# Patient Record
Sex: Female | Born: 1970 | Race: White | Hispanic: No | Marital: Single | Smoking: Current every day smoker
Health system: Southern US, Community
[De-identification: ages and names within clinical notes are randomized; demographics above are authoritative.]

## PROBLEM LIST (undated history)

## (undated) HISTORY — PX: CHOLECYSTECTOMY: SHX55

## (undated) HISTORY — PX: TUBAL LIGATION: SHX77

---

## 2003-11-19 ENCOUNTER — Emergency Department: Payer: Self-pay | Admitting: Emergency Medicine

## 2004-04-17 ENCOUNTER — Emergency Department: Payer: Self-pay | Admitting: General Practice

## 2004-04-18 ENCOUNTER — Ambulatory Visit: Payer: Self-pay | Admitting: General Practice

## 2005-11-23 ENCOUNTER — Emergency Department: Payer: Self-pay

## 2006-01-30 ENCOUNTER — Emergency Department: Payer: Self-pay | Admitting: General Practice

## 2006-09-23 ENCOUNTER — Other Ambulatory Visit: Payer: Self-pay

## 2006-09-23 ENCOUNTER — Emergency Department: Payer: Self-pay | Admitting: Internal Medicine

## 2007-08-30 ENCOUNTER — Emergency Department: Payer: Self-pay | Admitting: Emergency Medicine

## 2007-09-11 ENCOUNTER — Emergency Department: Payer: Self-pay | Admitting: Emergency Medicine

## 2008-12-03 ENCOUNTER — Emergency Department: Payer: Self-pay | Admitting: Emergency Medicine

## 2009-11-30 ENCOUNTER — Inpatient Hospital Stay: Payer: Self-pay | Admitting: Surgery

## 2009-12-04 LAB — PATHOLOGY REPORT

## 2009-12-19 ENCOUNTER — Inpatient Hospital Stay: Payer: Self-pay | Admitting: Surgery

## 2009-12-23 LAB — PATHOLOGY REPORT

## 2010-01-23 ENCOUNTER — Emergency Department: Payer: Self-pay | Admitting: Emergency Medicine

## 2010-07-13 ENCOUNTER — Emergency Department: Payer: Self-pay | Admitting: *Deleted

## 2011-01-11 ENCOUNTER — Ambulatory Visit: Payer: Self-pay | Admitting: Family Medicine

## 2011-11-25 ENCOUNTER — Emergency Department: Payer: Self-pay | Admitting: Emergency Medicine

## 2011-11-25 LAB — URINALYSIS, COMPLETE
Bacteria: NONE SEEN
Bilirubin,UR: NEGATIVE
Blood: NEGATIVE
Glucose,UR: NEGATIVE mg/dL (ref 0–75)
Leukocyte Esterase: NEGATIVE
Nitrite: NEGATIVE
RBC,UR: 4 /HPF (ref 0–5)
Specific Gravity: 1.019 (ref 1.003–1.030)
Squamous Epithelial: 1
WBC UR: <1 /HPF (ref 0–5)

## 2011-11-25 LAB — CBC
HCT: 42.9 % (ref 35.0–47.0)
HGB: 15.1 g/dL (ref 12.0–16.0)
MCH: 32.7 pg (ref 26.0–34.0)
MCHC: 35.2 g/dL (ref 32.0–36.0)
MCV: 93 fL (ref 80–100)
Platelet: 248 10*3/uL (ref 150–440)

## 2011-11-25 LAB — TROPONIN I: Troponin-I: <0.02 ng/mL

## 2011-11-25 LAB — COMPREHENSIVE METABOLIC PANEL
BUN: 8 mg/dL (ref 7–18)
Bilirubin,Total: 0.3 mg/dL (ref 0.2–1.0)
Calcium, Total: 8.8 mg/dL (ref 8.5–10.1)
Chloride: 109 mmol/L — ABNORMAL HIGH (ref 98–107)
Co2: 24 mmol/L (ref 21–32)
EGFR (African American): >60
EGFR (Non-African Amer.): >60
Osmolality: 283 (ref 275–301)
Potassium: 3.7 mmol/L (ref 3.5–5.1)
SGOT(AST): 28 U/L (ref 15–37)
SGPT (ALT): 48 U/L (ref 12–78)

## 2011-11-25 LAB — LIPASE, BLOOD: Lipase: 165 U/L (ref 73–393)

## 2012-03-15 ENCOUNTER — Emergency Department: Payer: Self-pay

## 2012-03-15 LAB — CBC
HCT: 44.4 % (ref 35.0–47.0)
MCV: 94 fL (ref 80–100)
RBC: 4.72 10*6/uL (ref 3.80–5.20)
RDW: 13 % (ref 11.5–14.5)
WBC: 7.5 10*3/uL (ref 3.6–11.0)

## 2012-03-15 LAB — BASIC METABOLIC PANEL
BUN: 12 mg/dL (ref 7–18)
Calcium, Total: 8.7 mg/dL (ref 8.5–10.1)
Co2: 23 mmol/L (ref 21–32)
Creatinine: 0.79 mg/dL (ref 0.60–1.30)
EGFR (Non-African Amer.): >60
Osmolality: 277 (ref 275–301)
Potassium: 4.2 mmol/L (ref 3.5–5.1)
Sodium: 139 mmol/L (ref 136–145)

## 2012-03-15 LAB — CK TOTAL AND CKMB (NOT AT ARMC): CK-MB: <0.5 ng/mL — ABNORMAL LOW (ref 0.5–3.6)

## 2012-03-15 LAB — URINALYSIS, COMPLETE
Bilirubin,UR: NEGATIVE
Nitrite: NEGATIVE
Ph: 6 (ref 4.5–8.0)
Protein: NEGATIVE
RBC,UR: 2 /HPF (ref 0–5)

## 2012-03-15 LAB — TROPONIN I
Troponin-I: <0.02 ng/mL
Troponin-I: <0.02 ng/mL

## 2012-03-15 LAB — TSH: Thyroid Stimulating Horm: 4.13 u[IU]/mL

## 2012-06-30 ENCOUNTER — Emergency Department: Payer: Self-pay | Admitting: Emergency Medicine

## 2012-09-30 ENCOUNTER — Emergency Department: Payer: Self-pay | Admitting: Emergency Medicine

## 2013-06-28 ENCOUNTER — Emergency Department: Payer: Self-pay | Admitting: Emergency Medicine

## 2013-06-28 LAB — URINALYSIS, COMPLETE
Bilirubin,UR: NEGATIVE
GLUCOSE, UR: NEGATIVE mg/dL (ref 0–75)
Ketone: NEGATIVE
Leukocyte Esterase: NEGATIVE
Nitrite: NEGATIVE
Ph: 5 (ref 4.5–8.0)
Protein: NEGATIVE
RBC,UR: 5 /HPF (ref 0–5)
Specific Gravity: 1.021 (ref 1.003–1.030)
WBC UR: 2 /HPF (ref 0–5)

## 2013-06-28 LAB — COMPREHENSIVE METABOLIC PANEL
ALBUMIN: 4.1 g/dL (ref 3.4–5.0)
AST: 27 U/L (ref 15–37)
Alkaline Phosphatase: 49 U/L
Anion Gap: 6 — ABNORMAL LOW (ref 7–16)
BILIRUBIN TOTAL: 0.3 mg/dL (ref 0.2–1.0)
BUN: 9 mg/dL (ref 7–18)
CALCIUM: 9.1 mg/dL (ref 8.5–10.1)
CHLORIDE: 107 mmol/L (ref 98–107)
CO2: 22 mmol/L (ref 21–32)
Creatinine: 0.85 mg/dL (ref 0.60–1.30)
EGFR (Non-African Amer.): >60
Glucose: 77 mg/dL (ref 65–99)
OSMOLALITY: 268 (ref 275–301)
Potassium: 3.7 mmol/L (ref 3.5–5.1)
SGPT (ALT): 19 U/L (ref 12–78)
Sodium: 135 mmol/L — ABNORMAL LOW (ref 136–145)
TOTAL PROTEIN: 7.7 g/dL (ref 6.4–8.2)

## 2013-06-28 LAB — CBC WITH DIFFERENTIAL/PLATELET
Basophil #: 0.1 10*3/uL (ref 0.0–0.1)
Basophil %: 0.6 %
Eosinophil #: 0.1 10*3/uL (ref 0.0–0.7)
Eosinophil %: 1.1 %
HCT: 46.6 % (ref 35.0–47.0)
HGB: 15.7 g/dL (ref 12.0–16.0)
LYMPHS PCT: 32 %
Lymphocyte #: 3.4 10*3/uL (ref 1.0–3.6)
MCH: 32.1 pg (ref 26.0–34.0)
MCHC: 33.8 g/dL (ref 32.0–36.0)
MCV: 95 fL (ref 80–100)
MONO ABS: 0.9 x10 3/mm (ref 0.2–0.9)
Monocyte %: 8 %
NEUTROS PCT: 58.3 %
Neutrophil #: 6.3 10*3/uL (ref 1.4–6.5)
Platelet: 230 10*3/uL (ref 150–440)
RBC: 4.91 10*6/uL (ref 3.80–5.20)
RDW: 13.2 % (ref 11.5–14.5)
WBC: 10.7 10*3/uL (ref 3.6–11.0)

## 2013-06-28 LAB — GC/CHLAMYDIA PROBE AMP

## 2013-06-28 LAB — TROPONIN I
Troponin-I: <0.02 ng/mL
Troponin-I: <0.02 ng/mL

## 2013-06-28 LAB — HCG, QUANTITATIVE, PREGNANCY: Beta Hcg, Quant.: <1 m[IU]/mL — ABNORMAL LOW

## 2013-06-28 LAB — WET PREP, GENITAL

## 2013-10-30 IMAGING — CT CT HEAD WITHOUT CONTRAST
1 series · 16 of 30 positions shown, 20 images · non-contrast
Comparison: none

REASON FOR EXAM: Frequent syncopal episodes (1 per week +)
COMMENTS:

[Series 2: soft tissue · axial · 0.38mm/px · z∈[+992,+1128]mm · 16 of 31 slices shown, 20 images]
[im 2/31  brain]
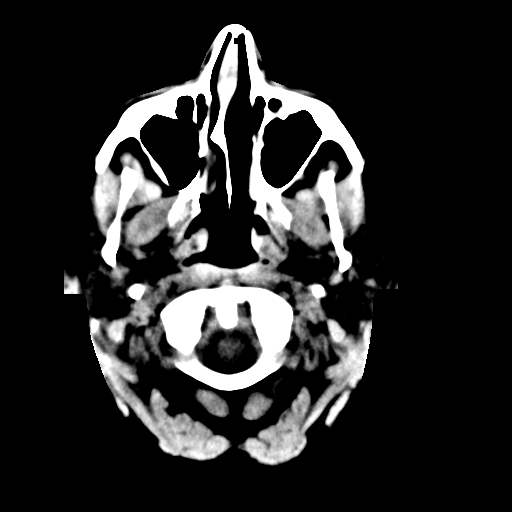
[im 2/31  bone]
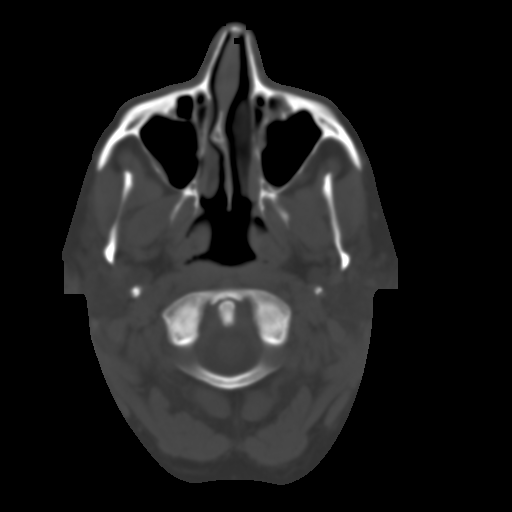
[im 4/31  brain]
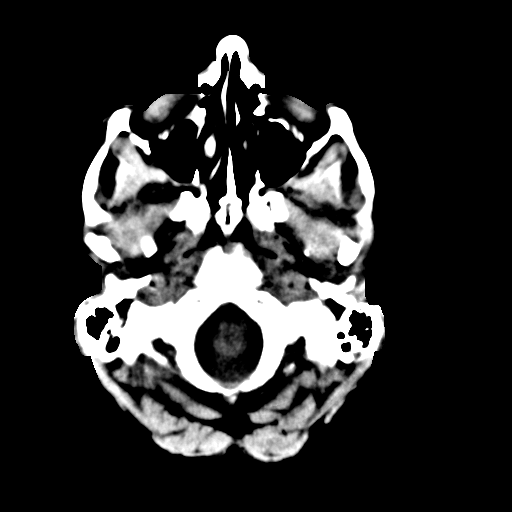
[im 6/31  brain]
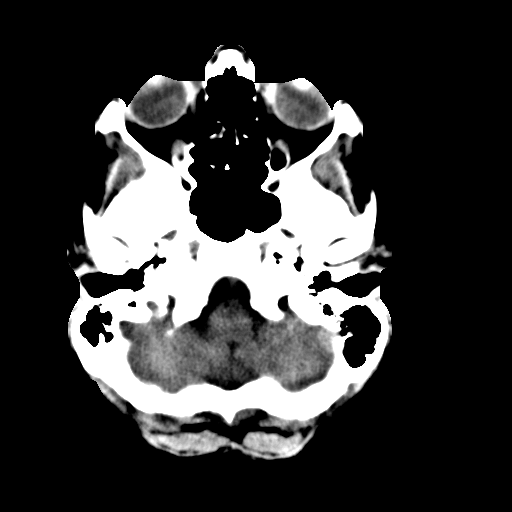
[im 8/31  brain]
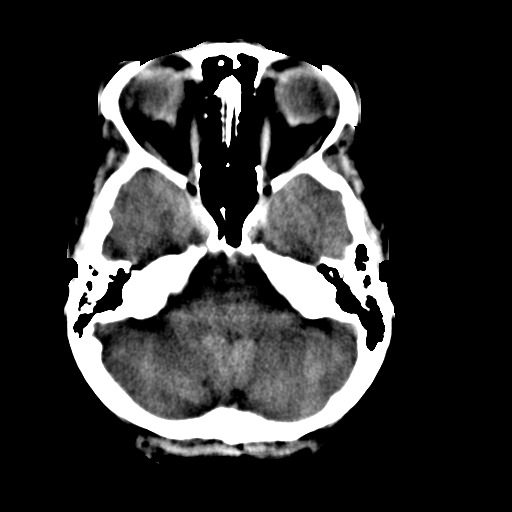
[im 9/31  brain]
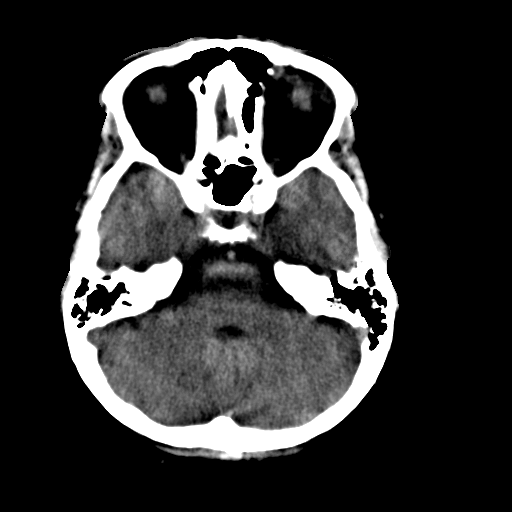
[im 9/31  bone]
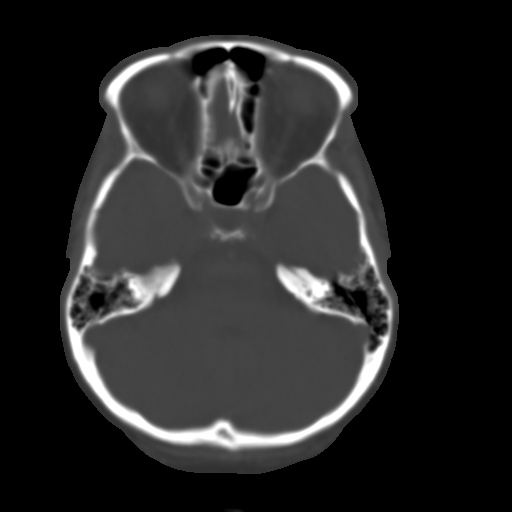
[im 11/31  brain]
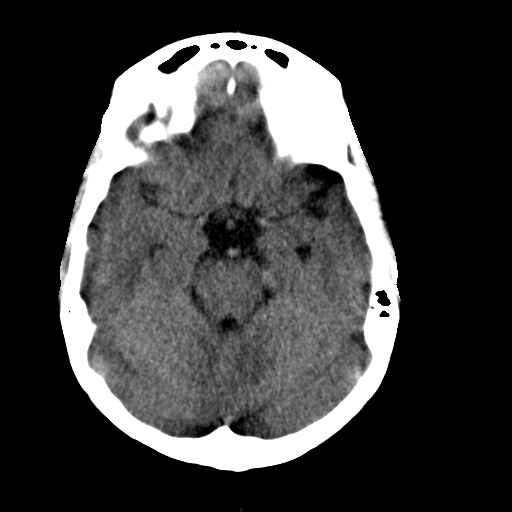
[im 13/31  brain]
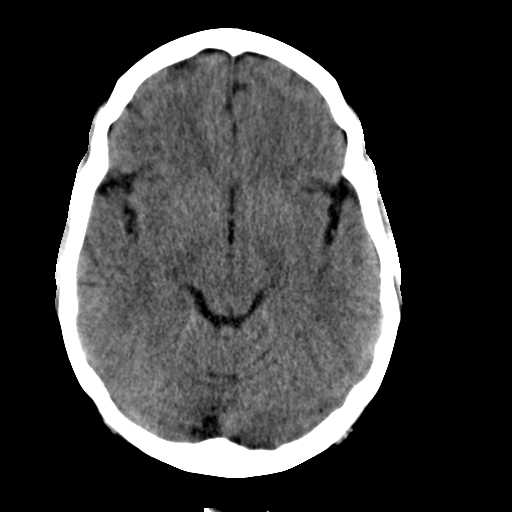
[im 15/31  brain]
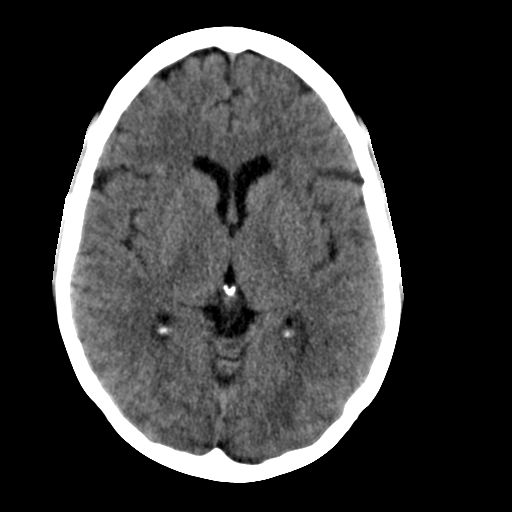
[im 16/31  brain]
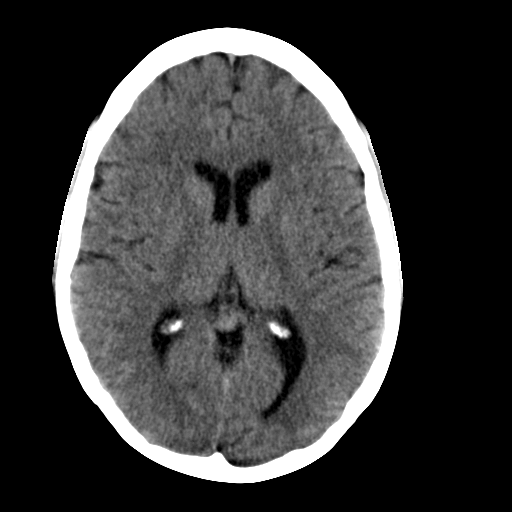
[im 16/31  bone]
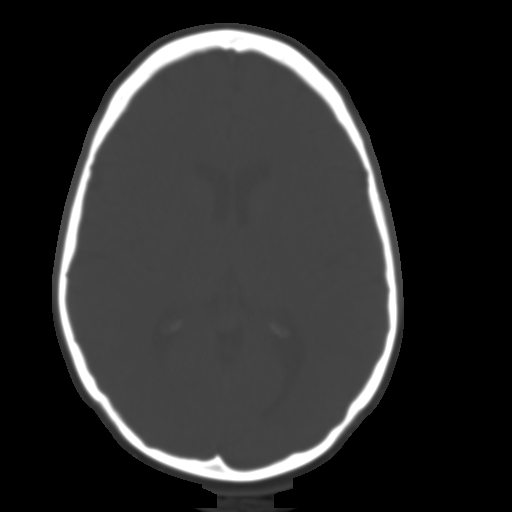
[im 18/31  brain]
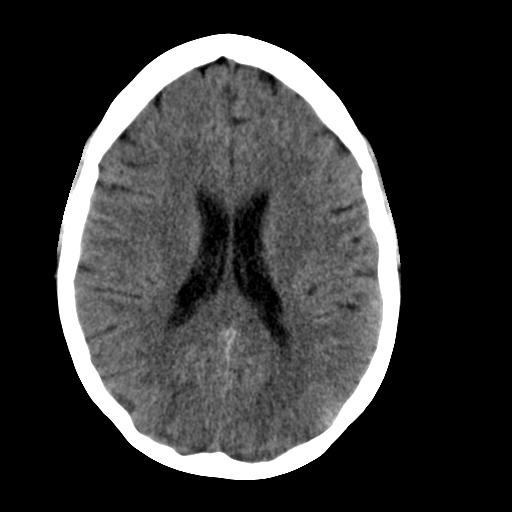
[im 20/31  brain]
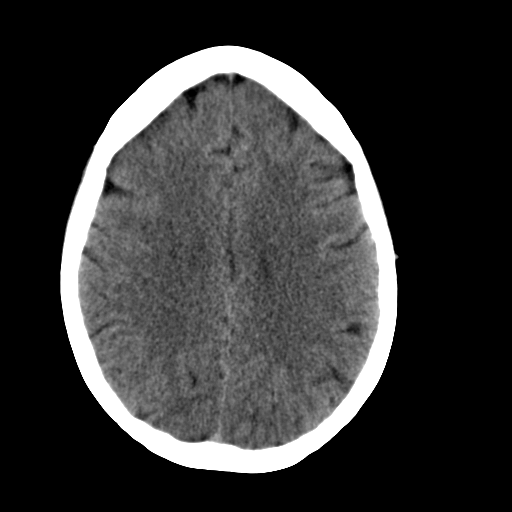
[im 22/31  brain]
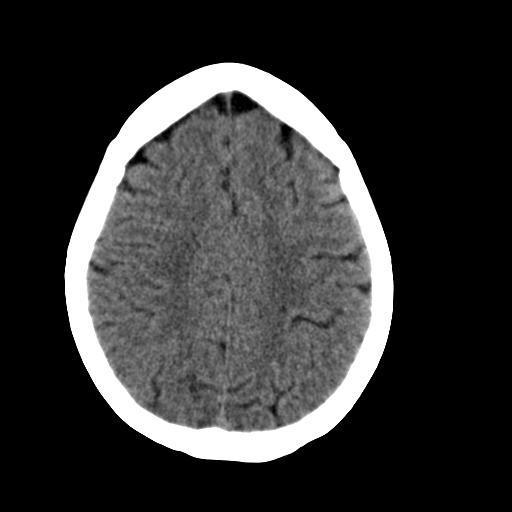
[im 23/31  brain]
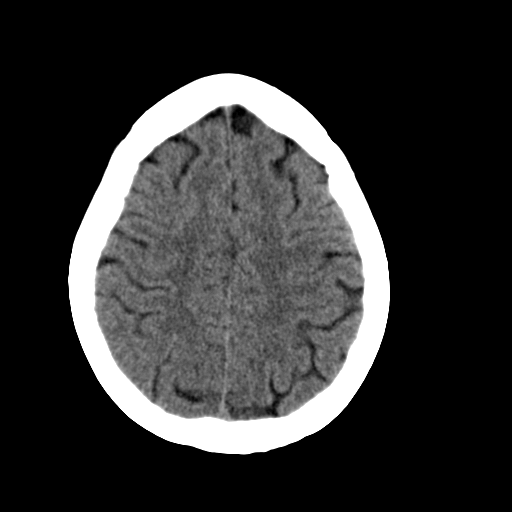
[im 23/31  bone]
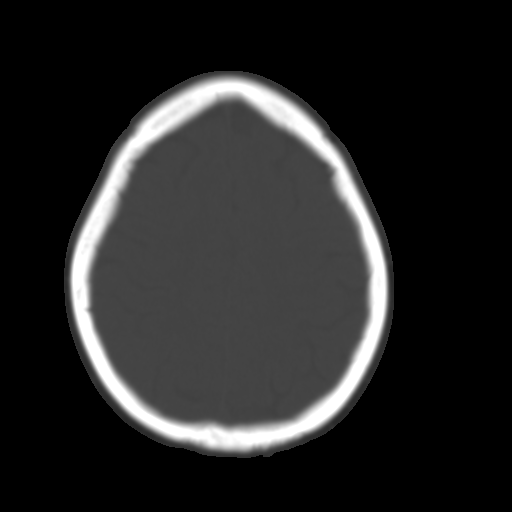
[im 25/31  brain]
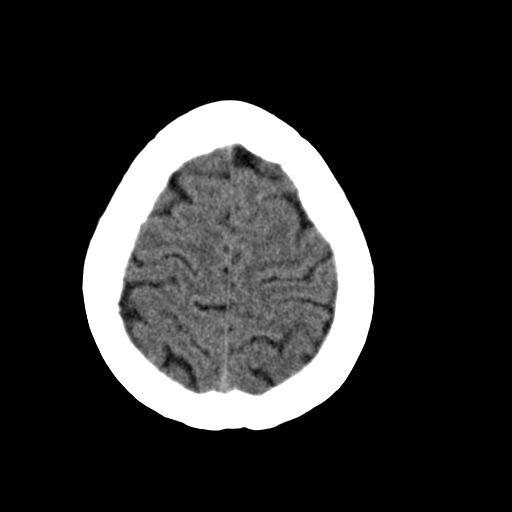
[im 27/31  brain]
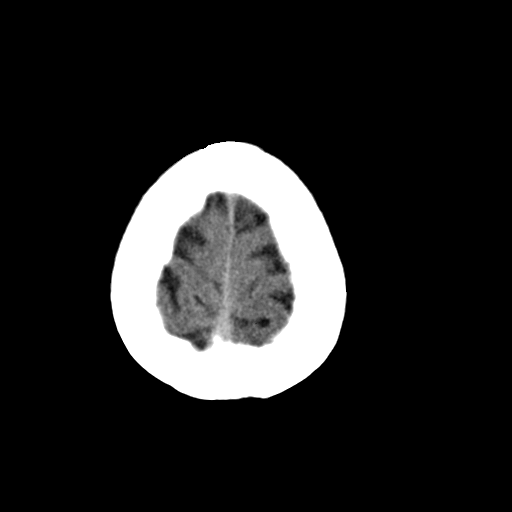
[im 29/31  brain]
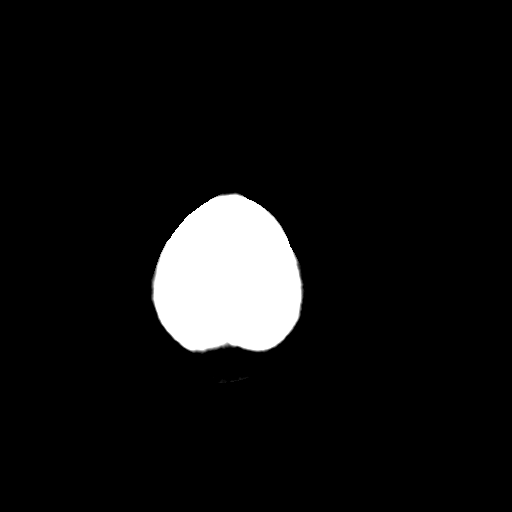

[16 of 30 positions shown; findings below may reference images not displayed]

PROCEDURE:     CT  - CT HEAD WITHOUT CONTRAST  - March 15, 2012  [DATE]

RESULT:     Axial noncontrast CT scanning was performed through the abdomen
and pelvis with reconstructions at 3 mm intervals and slice thicknesses.

The ventricles are normal in size and position. There is no intracranial
hemorrhage nor intracranial mass effect. The cerebellum and brainstem are
normal in density. There is no evidence of an evolving ischemic infarction.
IMPRESSION: There is no acute intracranial abnormality.

[REDACTED]

## 2014-01-23 ENCOUNTER — Emergency Department: Payer: Self-pay | Admitting: Emergency Medicine

## 2014-04-02 ENCOUNTER — Emergency Department: Payer: Self-pay | Admitting: Emergency Medicine

## 2014-08-08 ENCOUNTER — Other Ambulatory Visit: Payer: Self-pay | Admitting: Family Medicine

## 2014-08-08 DIAGNOSIS — R634 Abnormal weight loss: Secondary | ICD-10-CM

## 2014-08-08 DIAGNOSIS — M5440 Lumbago with sciatica, unspecified side: Secondary | ICD-10-CM

## 2014-08-14 ENCOUNTER — Ambulatory Visit
Admission: RE | Admit: 2014-08-14 | Discharge: 2014-08-14 | Disposition: A | Discharge status: Home / Self Care | Payer: Medicaid Other | Source: Ambulatory Visit | Attending: Family Medicine | Admitting: Family Medicine

## 2014-08-14 DIAGNOSIS — M5440 Lumbago with sciatica, unspecified side: Secondary | ICD-10-CM

## 2014-08-14 DIAGNOSIS — M5127 Other intervertebral disc displacement, lumbosacral region: Secondary | ICD-10-CM | POA: Insufficient documentation

## 2014-08-14 DIAGNOSIS — R634 Abnormal weight loss: Secondary | ICD-10-CM | POA: Insufficient documentation

## 2014-08-14 DIAGNOSIS — M545 Low back pain: Secondary | ICD-10-CM | POA: Diagnosis present

## 2015-07-07 ENCOUNTER — Encounter: Payer: Self-pay | Admitting: Emergency Medicine

## 2015-07-07 ENCOUNTER — Emergency Department
Admission: EM | Admit: 2015-07-07 | Discharge: 2015-07-07 | Disposition: A | Discharge status: Home / Self Care | Payer: Medicaid Other | Attending: Emergency Medicine | Admitting: Emergency Medicine

## 2015-07-07 DIAGNOSIS — K029 Dental caries, unspecified: Secondary | ICD-10-CM | POA: Insufficient documentation

## 2015-07-07 DIAGNOSIS — K047 Periapical abscess without sinus: Secondary | ICD-10-CM

## 2015-07-07 DIAGNOSIS — F17200 Nicotine dependence, unspecified, uncomplicated: Secondary | ICD-10-CM | POA: Insufficient documentation

## 2015-07-07 MED ORDER — LIDOCAINE VISCOUS 2 % MT SOLN: 20.0000 mL | OROMUCOSAL | Status: DC | PRN

## 2015-07-07 MED ORDER — KETOROLAC TROMETHAMINE 60 MG/2ML IM SOLN
60.0000 mg | Freq: Once | INTRAMUSCULAR | Status: AC
  Administered 2015-07-07: 60 mg via INTRAMUSCULAR
  Filled 2015-07-07: qty 2

## 2015-07-07 MED ORDER — CLINDAMYCIN HCL 300 MG PO CAPS: 300.0000 mg | ORAL_CAPSULE | Freq: Three times a day (TID) | ORAL | Status: DC

## 2015-07-07 MED ORDER — OXYCODONE-ACETAMINOPHEN 5-325 MG PO TABS: 1.0000 | ORAL_TABLET | ORAL | Status: DC | PRN

## 2015-07-07 MED ORDER — DIAZEPAM 5 MG PO TABS: 5.0000 mg | ORAL_TABLET | Freq: Three times a day (TID) | ORAL | Status: AC | PRN

## 2015-07-07 MED ORDER — OXYCODONE-ACETAMINOPHEN 5-325 MG PO TABS
1.0000 | ORAL_TABLET | Freq: Once | ORAL | Status: AC
  Administered 2015-07-07: 1 via ORAL
  Filled 2015-07-07: qty 1

## 2015-07-07 NOTE — ED Notes (Signed)
States she has a broken tooth to left lower gumline states pain started on Friday  But became worse on sat swelling noted to left side of face ..also having stabbing pain to left ear

## 2015-07-07 NOTE — ED Provider Notes (Signed)
[facility] Emergency Department Provider Note  ____________________________________________  Time seen: Approximately 8:08 AM  I have reviewed the triage vital signs and the nursing notes.   HISTORY  Chief Complaint Dental Pain    HPI Claudia Sexton is a 45 y.o. female presents for evaluation of severe dental pain to the left lower molar. Patient states that pain is been on and off for years and progressively getting worse but has a dentist. She states the pain is unbearable at this time. No relief with any over-the-counter medication.   History reviewed. No pertinent past medical history.  There are no active problems to display for this patient.   Past Surgical History  Procedure Laterality Date  . Tubal ligation      Current Outpatient Rx  Name  Route  Sig  Dispense  Refill  . clindamycin (CLEOCIN) 300 MG capsule   Oral   Take 1 capsule (300 mg total) by mouth 3 (three) times daily.   30 capsule   0   . diazepam (VALIUM) 5 MG tablet   Oral   Take 1 tablet (5 mg total) by mouth every 8 (eight) hours as needed for sedation. Prior to next dentist appointment   1 tablet   0   . lidocaine (XYLOCAINE) 2 % solution   Mouth/Throat   Use as directed 20 mLs in the mouth or throat as needed for mouth pain.   100 mL   0   . oxyCODONE-acetaminophen (ROXICET) 5-325 MG tablet   Oral   Take 1-2 tablets by mouth every 4 (four) hours as needed for severe pain.   15 tablet   0     Allergies Morphine and related and Penicillins  No family history on file.  Social History Social History  Substance Use Topics  . Smoking status: Current Every Day Smoker  . Smokeless tobacco: None  . Alcohol Use: No    Review of Systems Constitutional: No fever/chills ENT: No sore throat.Extensive dental caries with swollen gum and left jaw.. Cardiovascular: Denies chest pain. Respiratory: Denies shortness of breath. Skin: Negative for  rash. Neurological: Negative for headaches, focal weakness or numbness.  10-point ROS otherwise negative.  ____________________________________________   PHYSICAL EXAM: BP 127/71 mmHg  Pulse 75  Temp(Src) 98.1 F (36.7 C) (Oral)  Resp 16  Ht 5\' 2"  (1.575 m)  Wt 62.596 kg  BMI 25.23 kg/m2  SpO2 99%  LMP 06/26/2015  VITAL SIGNS: ED Triage Vitals  Enc Vitals Group     BP --      Pulse --      Resp --      Temp --      Temp src --      SpO2 --      Weight --      Height --      Head Cir --      Peak Flow --      Pain Score --      Pain Loc --      Pain Edu? --      Excl. in GC? --     Constitutional: Alert and oriented. Well appearing and in no acute distress. Nose: No congestion/rhinnorhea. Mouth/Throat: Obvious dental caries with multiple fractured teeth and dental caries scattered throughout the mouth. Neck: No stridor. Full range of motion nontender no adenopathy noted. Positive adenopathy noted to the left jaw and gum line. Cardiovascular: Normal rate, regular rhythm. Grossly normal heart sounds.  Good peripheral circulation.  Neurologic:  Normal speech and language. No gross focal neurologic deficits are appreciated. No gait instability. Skin:  Skin is warm, dry and intact. No rash noted. Psychiatric: Mood and affect are normal. Speech and behavior are normal.  ____________________________________________   LABS (all labs ordered are listed, but only abnormal results are displayed)  Labs Reviewed - No data to display ____________________________________________  EKG   ____________________________________________  RADIOLOGY   ____________________________________________   PROCEDURES  Procedure(s) performed: None  Critical Care performed: No  ____________________________________________   INITIAL IMPRESSION / ASSESSMENT AND PLAN / ED COURSE  Pertinent labs & imaging results that were available during my care of the patient were reviewed by  me and considered in my medical decision making (see chart for details).  Severe dental caries with dental abscess. Rx given for E-Mycin 3 mg 3 times a day, Percocet 5/325. Viscous lidocaine to use as needed. Patient was prescribed one Valium prior to her next dental visit. She was given a list of dental providers and told to follow up as soon as possible. ____________________________________________   FINAL CLINICAL IMPRESSION(S) / ED DIAGNOSES  Final diagnoses:  Dental abscess  Infected dental carries     This chart was dictated using voice recognition software/Dragon. Despite best efforts to proofread, errors can occur which can change the meaning. Any change was purely unintentional.   Evangeline Dakin, PA-C 07/07/15 0981  Jennye Moccasin, MD 07/07/15 1531

## 2015-07-07 NOTE — Discharge Instructions (Signed)
OPTIONS FOR DENTAL FOLLOW UP CARE ° °[place] [facility] and [facility] - Local Safety Net Dental Clinics °http://www.ncdhhs.gov/dph/oralhealth/services/safetynetclinics.htm °  °[facility] (336-562-3123) ° °[facility] [place] ([phone]) ° °[facility] ([phone] ext [phone]) ° °[facility] ([phone]) ° °[facility] ([phone]) °This clinic caters to the indigent population and is on a lottery system. °Location: °[facility], [address], [place] °Clinic Hours: °Wednesdays from 6pm - 9pm, patients seen by a lottery system. °For dates, call or go to [email or web address]/[email or web address] °Services: °Cleanings, fillings and simple extractions. °Payment Options: °DENTAL WORK IS FREE OF CHARGE. Bring proof of income or support. °Best way to get seen: °Arrive at 5:15 pm - this is a lottery, NOT first come/first serve, so arriving earlier will not increase your chances of being seen. °  °  °[facility] °[phone] °Select option 1 for emergencies °  °Location: °[facility], [address], [place] °Clinic Hours: °No walk-ins accepted - call the day before to schedule an appointment. °Check in times are 9:30 am and 1:30 pm. °Services: °Simple extractions, temporary fillings, pulpectomy/pulp debridement, uncomplicated abscess drainage. °Payment Options: °PAYMENT IS DUE AT THE TIME OF SERVICE.  Fee is usually $100-200, additional surgical procedures (e.g. abscess drainage) may be extra. °Cash, checks, Visa/MasterCard accepted.  Can file Medicaid if patient is covered for dental - patient should call case worker to check. °No discount for [facility] patients. °Best way to get seen: °MUST call the day before and get onto the schedule. Can usually be seen the next 1-2 days. No walk-ins accepted. °  °  °[facility] °[phone] °   °Location: °[facility], [address], [place] °Clinic Hours: °M, W, Th, F 8am or 1:30pm, Tues 9a or 1:30 - first come/first served. °Services: °Simple extractions, temporary fillings, uncomplicated abscess drainage.  You do not need to be an [place] resident. °Payment Options: °PAYMENT IS DUE AT THE TIME OF SERVICE. °Dental insurance, otherwise sliding scale - bring proof of income or support. °Depending on income and treatment needed, cost is usually $50-200. °Best way to get seen: °Arrive early as it is first come/first served. °  °  °[facility] °[phone] °  °Location: °[address] °Clinic Hours: °Mon-Thu 8a-5p °Services: °Most basic dental services including extractions and fillings. °Payment Options: °PAYMENT IS DUE AT THE TIME OF SERVICE. °Sliding scale, up to 50% off - bring proof if income or support. °Medicaid with dental option accepted. °Best way to get seen: °Call to schedule an appointment, can usually be seen within 2 weeks OR they will try to see walk-ins - show up at 8a or 2p (you may have to wait). °  °  °[facility] °[phone] °[place] RESIDENTS ONLY °  °Location: °[facility], [address], [place],  [address] °Clinic Hours: By appointment only. °Monday - Thursday 8am-5pm, Friday 8am-12pm °Services: Cleanings, fillings, extractions. °Payment Options: °PAYMENT IS DUE AT THE TIME OF SERVICE. °Cash, Visa or MasterCard. Sliding scale - $30 minimum per service. °Best way to get seen: °Come in to office, complete packet and make an appointment - need proof of income °or support monies for each household member and proof of [place] residence. °Usually takes about a month to get in. °  °  °[facility] °[phone] °  °Location: °[address]   [place] [facility] Hours: Walk-in Urgent Care Dental Services are offered Monday-Friday  mornings only. The numbers of emergencies accepted daily is limited to the number of providers available. Maximum 15 - Mondays, Wednesdays & Thursdays Maximum 10 - Tuesdays & Fridays Services: You do not need to be a [facility] resident to be seen for a dental emergency. Emergencies are defined as pain, swelling, abnormal bleeding, or dental trauma. Walkins will receive x-rays if needed. NOTE: Dental cleaning is not an emergency. Payment Options: PAYMENT IS DUE AT THE TIME OF SERVICE. Minimum co-pay is $40.00 for uninsured patients. Minimum co-pay is $3.00 for Medicaid with dental coverage. Dental Insurance is accepted and must be presented at time of visit. Medicare does not cover dental. Forms of payment: Cash, credit card, checks. Best way to get seen: If not previously registered with the clinic, walk-in dental registration begins at 7:15 am and is on a first come/first serve basis. If previously registered with the clinic, call to make an appointment.     [facility] [phone] [place] RESIDENTS ONLY   Location: [address] [place], [place] Clinic Hours: Mon-Thu 10a-2p Services: Extractions only! Payment Options: FREE (donations accepted) - bring proof of income or support Best way to get seen: Call and schedule an appointment OR come at 8am on the 1st Monday of every month (except for holidays) when it is first come/first served.     [facility] [phone]   Location: [address] [place], [place] Clinic Hours: Friday mornings Services, Payment Options, Best way to get seen: Call for info  Dental Abscess A dental abscess is a collection of pus in or around a tooth. CAUSES This condition is caused by a bacterial infection around the root of the tooth that involves the inner part of the tooth (pulp). It may result from:  Severe tooth decay.  Trauma to the tooth that allows bacteria to enter into the pulp, such as a broken or chipped  tooth.  Severe gum disease around a tooth. SYMPTOMS Symptoms of this condition include:  Severe pain in and around the infected tooth.  Swelling and redness around the infected tooth, in the mouth, or in the face.  Tenderness.  Pus drainage.  Bad breath.  Bitter taste in the mouth.  Difficulty swallowing.  Difficulty opening the mouth.  Nausea.  Vomiting.  Chills.  Swollen neck glands.  Fever. DIAGNOSIS This condition is diagnosed with examination of the infected tooth. During the exam, your dentist may tap on the infected tooth. Your dentist will also ask about your medical and dental history and may order X-rays. TREATMENT This condition is treated by eliminating the infection. This may be done with:  Antibiotic medicine.  A root canal. This may be performed to save the tooth.  Pulling (extracting) the tooth. This may also involve draining the abscess. This is done if the tooth cannot be saved. HOME CARE INSTRUCTIONS  Take medicines only as directed by your dentist.  If you were prescribed antibiotic medicine, finish all of it even if you start to feel better.  Rinse your mouth (gargle) often with salt water to relieve pain or swelling.  Do not drive or operate heavy machinery while taking pain medicine.  Do not apply heat to the outside of your mouth.  Keep all follow-up visits as directed by your dentist. This is important. SEEK MEDICAL CARE IF:  Your pain is worse and is not helped by medicine. SEEK IMMEDIATE MEDICAL CARE IF:  You have a fever or chills.  Your symptoms suddenly get worse.  You have a very bad headache.  You have problems breathing or swallowing.  You have trouble opening your mouth.  You have swelling in your neck or around your eye.   This information is not intended to replace advice given to you by your health care provider. Make sure you discuss any questions you have with your health care provider.   Document  Released: 01/11/2005 Document Revised: 05/28/2014 Document Reviewed: 01/08/2014 Elsevier Interactive Patient Education 2016 Elsevier Inc.  Dental Caries Dental caries (also called tooth decay) is the most common oral disease. It can occur at any age but is more common in children and young adults.  HOW DENTAL CARIES DEVELOPS  The process of decay begins when bacteria and foods (particularly sugars and starches) combine in your mouth to produce plaque. Plaque is a substance that sticks to the hard, outer surface of a tooth (enamel). The bacteria in plaque produce acids that attack enamel. These acids may also attack the root surface of a tooth (cementum) if it is exposed. Repeated attacks dissolve these surfaces and create holes in the tooth (cavities). If left untreated, the acids destroy the other layers of the tooth.  RISK FACTORS  Frequent sipping of sugary beverages.   Frequent snacking on sugary and starchy foods, especially those that easily get stuck in the teeth.   Poor oral hygiene.   Dry mouth.   Substance abuse such as methamphetamine abuse.   Broken or poor-fitting dental restorations.   Eating disorders.   Gastroesophageal reflux disease (GERD).   Certain radiation treatments to the head and neck. SYMPTOMS In the early stages of dental caries, symptoms are seldom present. Sometimes white, chalky areas may be seen on the enamel or other tooth layers. In later stages, symptoms may include:  Pits and holes on the enamel.  Toothache after sweet, hot, or cold foods or drinks are consumed.  Pain around the tooth.  Swelling around the tooth. DIAGNOSIS  Most of the time, dental caries is detected during a regular dental checkup. A diagnosis is made after a thorough medical and dental history is taken and the surfaces of your teeth are checked for signs of dental caries. Sometimes special instruments, such as lasers, are used to check for dental caries. Dental X-ray  exams may be taken so that areas not visible to the eye (such as between the contact areas of the teeth) can be checked for cavities.  TREATMENT  If dental caries is in its early stages, it may be reversed with a fluoride treatment or an application of a remineralizing agent at the dental office. Thorough brushing and flossing at home is needed to aid these treatments. If it is in its later stages, treatment depends on the location and extent of tooth destruction:   If a small area of the tooth has been destroyed, the destroyed area will be removed and cavities will be filled with a material such as gold, silver amalgam, or composite resin.   If a large area of the tooth has been destroyed, the destroyed area will be removed and a cap (crown) will be fitted over the remaining tooth structure.   If the center part of the tooth (pulp) is affected, a procedure called a root canal will be needed before a filling or crown can be placed.   If most of the tooth has been destroyed, the tooth may need to be pulled (extracted). HOME  CARE INSTRUCTIONS You can prevent, stop, or reverse dental caries at home by practicing good oral hygiene. Good oral hygiene includes:  Thoroughly cleaning your teeth at least twice a day with a toothbrush and dental floss.   Using a fluoride toothpaste. A fluoride mouth rinse may also be used if recommended by your dentist or health care provider.   Restricting the amount of sugary and starchy foods and sugary liquids you consume.   Avoiding frequent snacking on these foods and sipping of these liquids.   Keeping regular visits with a dentist for checkups and cleanings. PREVENTION   Practice good oral hygiene.  Consider a dental sealant. A dental sealant is a coating material that is applied by your dentist to the pits and grooves of teeth. The sealant prevents food from being trapped in them. It may protect the teeth for several years.  Ask about fluoride  supplements if you live in a community without fluorinated water or with water that has a low fluoride content. Use fluoride supplements as directed by your dentist or health care provider.  Allow fluoride varnish applications to teeth if directed by your dentist or health care provider.   This information is not intended to replace advice given to you by your health care provider. Make sure you discuss any questions you have with your health care provider.   Document Released: 10/03/2001 Document Revised: 02/01/2014 Document Reviewed: 01/14/2012 Elsevier Interactive Patient Education [employer]2016 Elsevier Inc.

## 2015-07-11 ENCOUNTER — Encounter: Payer: Self-pay | Admitting: Emergency Medicine

## 2015-07-11 ENCOUNTER — Emergency Department
Admission: EM | Admit: 2015-07-11 | Discharge: 2015-07-11 | Disposition: A | Discharge status: Home / Self Care | Payer: Medicaid Other | Attending: Emergency Medicine | Admitting: Emergency Medicine

## 2015-07-11 DIAGNOSIS — K029 Dental caries, unspecified: Secondary | ICD-10-CM | POA: Insufficient documentation

## 2015-07-11 DIAGNOSIS — S025XXA Fracture of tooth (traumatic), initial encounter for closed fracture: Secondary | ICD-10-CM

## 2015-07-11 DIAGNOSIS — K122 Cellulitis and abscess of mouth: Secondary | ICD-10-CM

## 2015-07-11 DIAGNOSIS — F17200 Nicotine dependence, unspecified, uncomplicated: Secondary | ICD-10-CM | POA: Insufficient documentation

## 2015-07-11 DIAGNOSIS — K047 Periapical abscess without sinus: Secondary | ICD-10-CM | POA: Insufficient documentation

## 2015-07-11 DIAGNOSIS — K0381 Cracked tooth: Secondary | ICD-10-CM | POA: Insufficient documentation

## 2015-07-11 MED ORDER — OXYCODONE-ACETAMINOPHEN 5-325 MG PO TABS
1.0000 | ORAL_TABLET | Freq: Once | ORAL | Status: AC
  Administered 2015-07-11: 1 via ORAL
  Filled 2015-07-11: qty 1

## 2015-07-11 MED ORDER — CEFTRIAXONE SODIUM 1 G IJ SOLR
500.0000 mg | Freq: Once | INTRAMUSCULAR | Status: AC
  Administered 2015-07-11: 500 mg via INTRAMUSCULAR
  Filled 2015-07-11: qty 10

## 2015-07-11 MED ORDER — CEPHALEXIN 500 MG PO CAPS: 500.0000 mg | ORAL_CAPSULE | Freq: Four times a day (QID) | ORAL | Status: AC

## 2015-07-11 NOTE — ED Notes (Signed)
Pt continues to have dental pain was seen on Monday for same. Pt states she is on atbx, but states the pain is in her ear. States she has seen a dentist but requires oral surgery and cannot afford it.

## 2015-07-11 NOTE — Discharge Instructions (Signed)
Dental Abscess    A dental abscess is a collection of pus in or around a tooth.  CAUSES  This condition is caused by a bacterial infection around the root of the tooth that involves the inner part of the tooth (pulp). It may result from:  Severe tooth decay.  Trauma to the tooth that allows bacteria to enter into the pulp, such as a broken or chipped tooth.  Severe gum disease around a tooth. SYMPTOMS  Symptoms of this condition include:  Severe pain in and around the infected tooth.  Swelling and redness around the infected tooth, in the mouth, or in the face.  Tenderness.  Pus drainage.  Bad breath.  Bitter taste in the mouth.  Difficulty swallowing.  Difficulty opening the mouth.  Nausea.  Vomiting.  Chills.  Swollen neck glands.  Fever. DIAGNOSIS  This condition is diagnosed with examination of the infected tooth. During the exam, your dentist may tap on the infected tooth. Your dentist will also ask about your medical and dental history and may order X-rays.  TREATMENT  This condition is treated by eliminating the infection. This may be done with:  Antibiotic medicine.  A root canal. This may be performed to save the tooth.  Pulling (extracting) the tooth. This may also involve draining the abscess. This is done if the tooth cannot be saved. HOME CARE INSTRUCTIONS  Take medicines only as directed by your dentist.  If you were prescribed antibiotic medicine, finish all of it even if you start to feel better.  Rinse your mouth (gargle) often with salt water to relieve pain or swelling.  Do not drive or operate heavy machinery while taking pain medicine.  Do not apply heat to the outside of your mouth.  Keep all follow-up visits as directed by your dentist. This is important. SEEK MEDICAL CARE IF:  Your pain is worse and is not helped by medicine. SEEK IMMEDIATE MEDICAL CARE IF:  You have a fever or chills.  Your symptoms suddenly get worse.  You have a very bad headache.   You have problems breathing or swallowing.  You have trouble opening your mouth.  You have swelling in your neck or around your eye. This information is not intended to replace advice given to you by your health care provider. Make sure you discuss any questions you have with your health care provider.  Document Released: 01/11/2005 Document Revised: 05/28/2014 Document Reviewed: 01/08/2014  Elsevier Interactive Patient Education 2016 [employer]   Dental Caries Dental caries is tooth decay. This decay can cause a hole in teeth (cavity) that can get bigger and deeper over time. HOME CARE  Brush and floss your teeth. Do this at least two times a day.  Use a fluoride toothpaste.  Use a mouth rinse if told by your dentist or doctor.  Eat less sugary and starchy foods. Drink less sugary drinks.  Avoid snacking often on sugary and starchy foods. Avoid sipping often on sugary drinks.  Keep regular checkups and cleanings with your dentist.  Use fluoride supplements if told by your dentist or doctor.  Allow fluoride to be applied to teeth if told by your dentist or doctor.   This information is not intended to replace advice given to you by your health care provider. Make sure you discuss any questions you have with your health care provider.   Document Released: 10/21/2007 Document Revised: 02/01/2014 Document Reviewed: 01/14/2012 Elsevier Interactive Patient Education 2016 [employer]  Dental Care and  Dentist Visits Dental care supports good overall health. Regular dental visits can also help you avoid dental pain, bleeding, infection, and other more serious health problems in the future. It is important to keep the mouth healthy because diseases in the teeth, gums, and other oral tissues can spread to other areas of the body. Some problems, such as diabetes, heart disease, and pre-term labor have been associated with poor oral health.  See your dentist every 6 months. If you  experience emergency problems such as a toothache or broken tooth, go to the dentist right away. If you see your dentist regularly, you may catch problems early. It is easier to be treated for problems in the early stages.  WHAT TO EXPECT AT A DENTIST VISIT  Your dentist will look for many common oral health problems and recommend proper treatment. At your regular dental visit, you can expect:  Gentle cleaning of the teeth and gums. This includes scraping and polishing. This helps to remove the sticky substance around the teeth and gums (plaque). Plaque forms in the mouth shortly after eating. Over time, plaque hardens on the teeth as tartar. If tartar is not removed regularly, it can cause problems. Cleaning also helps remove stains.  Periodic X-rays. These pictures of the teeth and supporting bone will help your dentist assess the health of your teeth.  Periodic fluoride treatments. Fluoride is a natural mineral shown to help strengthen teeth. Fluoride treatmentinvolves applying a fluoride gel or varnish to the teeth. It is most commonly done in children.  Examination of the mouth, tongue, jaws, teeth, and gums to look for any oral health problems, such as:  Cavities (dental caries). This is decay on the tooth caused by plaque, sugar, and acid in the mouth. It is best to catch a cavity when it is small.  Inflammation of the gums caused by plaque buildup (gingivitis).  Problems with the mouth or malformed or misaligned teeth.  Oral cancer or other diseases of the soft tissues or jaws. KEEP YOUR TEETH AND GUMS HEALTHY For healthy teeth and gums, follow these general guidelines as well as your dentist's specific advice:  Have your teeth professionally cleaned at the dentist every 6 months.  Brush twice daily with a fluoride toothpaste.  Floss your teeth daily.  Ask your dentist if you need fluoride supplements, treatments, or fluoride toothpaste.  Eat a healthy diet. Reduce foods and  drinks with added sugar.  Avoid smoking. TREATMENT FOR ORAL HEALTH PROBLEMS If you have oral health problems, treatment varies depending on the conditions present in your teeth and gums.  Your caregiver will most likely recommend good oral hygiene at each visit.  For cavities, gingivitis, or other oral health disease, your caregiver will perform a procedure to treat the problem. This is typically done at a separate appointment. Sometimes your caregiver will refer you to another dental specialist for specific tooth problems or for surgery. SEEK IMMEDIATE DENTAL CARE IF:  You have pain, bleeding, or soreness in the gum, tooth, jaw, or mouth area.  A permanent tooth becomes loose or separated from the gum socket.  You experience a blow or injury to the mouth or jaw area.   This information is not intended to replace advice given to you by your health care provider. Make sure you discuss any questions you have with your health care provider.   Document Released: 09/23/2010 Document Revised: 04/05/2011 Document Reviewed: 09/23/2010 Elsevier Interactive Patient Education 2016 Elsevier Inc.  Dental Pain Dental pain may  be caused by many things, including:  Tooth decay (cavities or caries). Cavities cause the nerve of your tooth to be open to air and hot or cold temperatures. This can cause pain or discomfort.  Abscess or infection. A dental abscess is an area that is full of infected pus from a bacterial infection in the inner part of the tooth (pulp). It usually happens at the end of the tooth's root.  Injury.  An unknown reason (idiopathic). Your pain may be mild or severe. It may only happen when:  You are chewing.  You are exposed to hot or cold temperature.  You are eating or drinking sugary foods or beverages, such as:  Soda.  Candy. Your pain may also be there all of the time. HOME CARE Watch your dental pain for any changes. Do these things to lessen your  discomfort:  Take medicines only as told by your dentist.  If your dentist tells you to take an antibiotic medicine, finish all of it even if you start to feel better.  Keep all follow-up visits as told by your dentist. This is important.  Do not apply heat to the outside of your face.  Rinse your mouth or gargle with salt water if told by your dentist. This helps with pain and swelling.  You can make salt water by adding  tsp of salt to 1 cup of warm water.  Apply ice to the painful area of your face:  Put ice in a plastic bag.  Place a towel between your skin and the bag.  Leave the ice on for 20 minutes, 2-3 times per day.  Avoid foods or drinks that cause you pain, such as:  Very hot or very cold foods or drinks.  Sweet or sugary foods or drinks. GET HELP IF:  Your pain is not helped with medicines.  Your symptoms are worse.  You have new symptoms. GET HELP RIGHT AWAY IF:  You cannot open your mouth.  You are having trouble breathing or swallowing.  You have a fever.  Your face, neck, or jaw is puffy (swollen).   This information is not intended to replace advice given to you by your health care provider. Make sure you discuss any questions you have with your health care provider.   Document Released: 06/30/2007 Document Revised: 05/28/2014 Document Reviewed: 01/07/2014 Elsevier Interactive Patient Education [employer]

## 2015-07-11 NOTE — ED Provider Notes (Signed)
[facility] Emergency Department Provider Note  ____________________________________________  Time seen: Approximately 7:15 AM  I have reviewed the triage vital signs and the nursing notes.   HISTORY  Chief Complaint Dental Pain    HPI Claudia Sexton is a 45 y.o. female , NAD, in pain, presents to the emergency department with continued left-sided dental pain. States she was seen on Monday for the same and has been on antibiotics and pain medications without any side effects. States that the pain has worsened and she feels the swelling is worse at this time. States she has been told by a dentist in [place], [place] that she would need to see an Transport planner for extraction of the affected tooth but she does not have the money to do so. Patient has not consulted her seen one of the local free her sliding scale dental clinics but is willing to do so today. Denies any fevers, chills, body aches. Has not had any numbness, weakness, tingling. Has been able to talk without significant pain. States that it hurts to chew on the left side of her mouth. Has not been able to sleep well over the last night due to the increasing pain. Notes that the pain can radiate into the left ear. Has not had any headaches or visual changes.   History reviewed. No pertinent past medical history.  There are no active problems to display for this patient.   Past Surgical History  Procedure Laterality Date  . Tubal ligation      Current Outpatient Rx  Name  Route  Sig  Dispense  Refill  . cephALEXin (KEFLEX) 500 MG capsule   Oral   Take 1 capsule (500 mg total) by mouth 4 (four) times daily.   40 capsule   0   . clindamycin (CLEOCIN) 300 MG capsule   Oral   Take 1 capsule (300 mg total) by mouth 3 (three) times daily.   30 capsule   0   . diazepam (VALIUM) 5 MG tablet   Oral   Take 1 tablet (5 mg total) by mouth every 8 (eight) hours as needed for sedation. Prior to  next dentist appointment   1 tablet   0   . lidocaine (XYLOCAINE) 2 % solution   Mouth/Throat   Use as directed 20 mLs in the mouth or throat as needed for mouth pain.   100 mL   0   . oxyCODONE-acetaminophen (ROXICET) 5-325 MG tablet   Oral   Take 1-2 tablets by mouth every 4 (four) hours as needed for severe pain.   15 tablet   0     Allergies Morphine and related and Penicillins  History reviewed. No pertinent family history.  Social History Social History  Substance Use Topics  . Smoking status: Current Every Day Smoker  . Smokeless tobacco: None  . Alcohol Use: No     Review of Systems  Constitutional: No fever/chills, fatigue Eyes: No visual changes.  ENT: Positive left ear pain, left dental pain. Cardiovascular: No chest pain. Respiratory: No shortness of breath. No wheezing.  Gastrointestinal: No abdominal pain.  No nausea, vomiting. Musculoskeletal: Negative for neck pain.  Skin: Positive swelling left jaw. Negative for rash, redness, skin sores. Neurological: Negative for headaches, focal weakness or numbness. No tingling 10-point ROS otherwise negative.  ____________________________________________   PHYSICAL EXAM:  VITAL SIGNS: ED Triage Vitals  Enc Vitals Group     BP 07/11/15 0713 135/95 mmHg  Pulse Rate 07/11/15 0713 102     Resp 07/11/15 0713 18     Temp 07/11/15 0713 98.2 F (36.8 C)     Temp Source 07/11/15 0713 Oral     SpO2 07/11/15 0713 100 %     Weight 07/11/15 0713 136 lb (61.689 kg)     Height 07/11/15 0713 5\' 2"  (1.575 m)     Head Cir --      Peak Flow --      Pain Score 07/11/15 0712 6     Pain Loc --      Pain Edu? --      Excl. in GC? --      Constitutional: Alert and oriented. Well appearing and in no acute distress, but in pain. Eyes: Conjunctivae are normal.  Head: Atraumatic. ENT:      Nose: No congestion/rhinnorhea.      Mouth/Throat: Mild erythema and swelling about the left lower gumline. Multiple teeth  with dental caries and fractures. Mucous membranes are moist.  Neck: Supple with full range of motion. No stridor. Hematological/Lymphatic/Immunilogical: No cervical lymphadenopathy. Cardiovascular: Good peripheral circulation. Respiratory: Normal respiratory effort without tachypnea or retractions.  Neurologic:  Normal speech and language. No gross focal neurologic deficits are appreciated.  Skin:  Skin is warm, dry and intact. No rash, redness, swelling, skin sores noted. Psychiatric: Mood and affect are normal. Speech and behavior are normal. Patient exhibits appropriate insight and judgement.   ____________________________________________   LABS  None ____________________________________________  EKG  None ____________________________________________  RADIOLOGY  None ____________________________________________    PROCEDURES  Procedure(s) performed: None    Medications  oxyCODONE-acetaminophen (PERCOCET/ROXICET) 5-325 MG per tablet 1 tablet (1 tablet Oral Given 07/11/15 0735)  cefTRIAXone (ROCEPHIN) injection 500 mg (500 mg Intramuscular Given 07/11/15 0748)   Patient notes she had a hive-like rash 12+ years ago while taking a penicillin. She is uncertain if she had true allergy to the medication or if it was something else. She denies any anaphylaxis type reaction to the medication. Considering the patient has not improved on the clindamycin, I will give the patient IM injection of Rocephin and monitor for 30 minutes to ensure no rash or other allergic reaction occurs.   ----------------------------------------- 8:26 AM on 07/11/2015 -----------------------------------------  Patient has tolerated IM Rocephin without itching, redness, rash, swelling about the neck/face/lip/tongue, difficulty swallowing or breathing. Pain has also decreased after administration of Percocet.  ____________________________________________   INITIAL IMPRESSION / ASSESSMENT AND PLAN /  ED COURSE  Patient's diagnosis is consistent with cellulitis and abscess of oral soft tissues due to chronic broken teeth and dental caries. Since patient tolerated IM Rocephin well without side effects, I will prescribe cephalexin 500 mg to take one tablet by mouth 4 times daily along with her currently prescribed clindamycin. Patient is instructed that if she notices any rash, hives that she is to discontinue the cephalexin immediately and take 50 mg of Benadryl by mouth. The patient notes any swelling, difficulty swallowing or breathing, nausea, vomiting she is reported to this emergency department for evaluation and treatment. Patient will be discharged home with instructions to [facility] for follow-up today on a walk-in basis.  Patient is given ED precautions to return to the ED for any worsening or new symptoms.    ____________________________________________  FINAL CLINICAL IMPRESSION(S) / ED DIAGNOSES  Final diagnoses:  Cellulitis and abscess of oral soft tissues  Broken tooth, closed, initial encounter  Dental caries  NEW MEDICATIONS STARTED DURING THIS VISIT:  New Prescriptions   CEPHALEXIN (KEFLEX) 500 MG CAPSULE    Take 1 capsule (500 mg total) by mouth 4 (four) times daily.         Hope Pigeon, PA-C 07/11/15 [id number]  Jennye Moccasin, MD 07/11/15 [phone]

## 2015-07-11 NOTE — ED Notes (Addendum)
Pt verbalized understanding of discharge instructions. Pt denies any itching/hives from antibiotic injection. NAD at this time.

## 2017-02-15 ENCOUNTER — Encounter: Payer: Self-pay | Admitting: Emergency Medicine

## 2017-02-15 ENCOUNTER — Emergency Department
Admission: EM | Admit: 2017-02-15 | Discharge: 2017-02-15 | Disposition: A | Discharge status: Home / Self Care | Payer: Self-pay | Attending: Emergency Medicine | Admitting: Emergency Medicine

## 2017-02-15 ENCOUNTER — Emergency Department: Payer: Self-pay

## 2017-02-15 DIAGNOSIS — R109 Unspecified abdominal pain: Secondary | ICD-10-CM | POA: Insufficient documentation

## 2017-02-15 DIAGNOSIS — Z79899 Other long term (current) drug therapy: Secondary | ICD-10-CM | POA: Insufficient documentation

## 2017-02-15 DIAGNOSIS — F17210 Nicotine dependence, cigarettes, uncomplicated: Secondary | ICD-10-CM | POA: Insufficient documentation

## 2017-02-15 DIAGNOSIS — R197 Diarrhea, unspecified: Secondary | ICD-10-CM | POA: Insufficient documentation

## 2017-02-15 LAB — URINALYSIS, COMPLETE (UACMP) WITH MICROSCOPIC
BILIRUBIN URINE: NEGATIVE
Bacteria, UA: NONE SEEN
GLUCOSE, UA: NEGATIVE mg/dL
KETONES UR: NEGATIVE mg/dL
LEUKOCYTES UA: NEGATIVE
NITRITE: NEGATIVE
Protein, ur: NEGATIVE mg/dL
Specific Gravity, Urine: 1.024 (ref 1.005–1.030)
pH: 5 (ref 5.0–8.0)

## 2017-02-15 LAB — COMPREHENSIVE METABOLIC PANEL
ALT: 15 U/L (ref 14–54)
AST: 18 U/L (ref 15–41)
Albumin: 4.2 g/dL (ref 3.5–5.0)
Alkaline Phosphatase: 52 U/L (ref 38–126)
Anion gap: 9 (ref 5–15)
BUN: 10 mg/dL (ref 6–20)
CO2: 23 mmol/L (ref 22–32)
CREATININE: 0.87 mg/dL (ref 0.44–1.00)
Calcium: 9.3 mg/dL (ref 8.9–10.3)
Chloride: 104 mmol/L (ref 101–111)
Glucose, Bld: 111 mg/dL — ABNORMAL HIGH (ref 65–99)
Potassium: 3.8 mmol/L (ref 3.5–5.1)
Sodium: 136 mmol/L (ref 135–145)
Total Bilirubin: 1.1 mg/dL (ref 0.3–1.2)
Total Protein: 7.6 g/dL (ref 6.5–8.1)

## 2017-02-15 LAB — CBC
HCT: 44.3 % (ref 35.0–47.0)
Hemoglobin: 15.2 g/dL (ref 12.0–16.0)
MCH: 31.8 pg (ref 26.0–34.0)
MCHC: 34.2 g/dL (ref 32.0–36.0)
MCV: 92.9 fL (ref 80.0–100.0)
PLATELETS: 210 10*3/uL (ref 150–440)
RBC: 4.77 MIL/uL (ref 3.80–5.20)
RDW: 13.3 % (ref 11.5–14.5)
WBC: 6 10*3/uL (ref 3.6–11.0)

## 2017-02-15 LAB — POCT PREGNANCY, URINE: Preg Test, Ur: NEGATIVE

## 2017-02-15 LAB — LIPASE, BLOOD: Lipase: 32 U/L (ref 11–51)

## 2017-02-15 MED ORDER — IOPAMIDOL (ISOVUE-300) INJECTION 61%
100.0000 mL | Freq: Once | INTRAVENOUS | Status: AC | PRN
  Administered 2017-02-15: 100 mL via INTRAVENOUS
  Filled 2017-02-15: qty 100

## 2017-02-15 MED ORDER — OXYCODONE-ACETAMINOPHEN 5-325 MG PO TABS
1.0000 | ORAL_TABLET | ORAL | Status: DC | PRN
  Administered 2017-02-15: 1 via ORAL

## 2017-02-15 MED ORDER — SODIUM CHLORIDE 0.9 % IV BOLUS (SEPSIS)
1000.0000 mL | Freq: Once | INTRAVENOUS | Status: AC
  Administered 2017-02-15: 1000 mL via INTRAVENOUS

## 2017-02-15 MED ORDER — ONDANSETRON HCL 4 MG PO TABS: 4.0000 mg | ORAL_TABLET | Freq: Three times a day (TID) | ORAL | 0 refills | Status: DC | PRN

## 2017-02-15 MED ORDER — OXYCODONE-ACETAMINOPHEN 5-325 MG PO TABS
ORAL_TABLET | ORAL | Status: AC
  Filled 2017-02-15: qty 1

## 2017-02-15 MED ORDER — ONDANSETRON HCL 4 MG/2ML IJ SOLN
4.0000 mg | Freq: Once | INTRAMUSCULAR | Status: AC
  Administered 2017-02-15: 4 mg via INTRAVENOUS
  Filled 2017-02-15: qty 2

## 2017-02-15 MED ORDER — IOPAMIDOL (ISOVUE-300) INJECTION 61%
30.0000 mL | Freq: Once | INTRAVENOUS | Status: AC
  Administered 2017-02-15: 30 mL via ORAL
  Filled 2017-02-15: qty 30

## 2017-02-15 MED ORDER — OXYCODONE-ACETAMINOPHEN 5-325 MG PO TABS
1.0000 | ORAL_TABLET | Freq: Once | ORAL | Status: AC
  Administered 2017-02-15: 1 via ORAL
  Filled 2017-02-15: qty 1

## 2017-02-15 NOTE — ED Triage Notes (Signed)
Pt in via POV with complaints of mid epigastric pain and diarrhea since 1730 last night.  Pt able to keep down liquids but has not taken in any solids today.  Pt denies any N/V at this time.

## 2017-02-15 NOTE — Discharge Instructions (Signed)
Severe pain, persistent vomiting, dehydration, that you cannot rectify with fluids, bleeding, high fever or you feel worse in any way return to the emergency department.

## 2017-02-15 NOTE — ED Notes (Signed)
Pt discharged to home.  Family member driving.  Discharge instructions reviewed.  Verbalized understanding.  No questions or concerns at this time.  Teach back verified.  Pt in NAD.  No items left in ED.   

## 2017-02-15 NOTE — ED Notes (Signed)
C/o LUQ pain that began last night at 1730. Pt reports diarrhea since pain began. Reports pain as intermittent. "sulfur burps". Denies NV.

## 2017-02-15 NOTE — ED Provider Notes (Addendum)
[facility] Emergency Department Provider Note  ____________________________________________   I have reviewed the triage vital signs and the nursing notes. Where available I have reviewed prior notes and, if possible and indicated, outside hospital notes.    HISTORY  Chief Complaint Abdominal Pain and Diarrhea    HPI Claudia Sexton is a 47 y.o. female is healthy, states that she had some questionable [place] food 2 days ago yesterday she started to have significant left-sided cramping abdominal pain, and watery diarrhea.  Denies fever no vomiting, no chest pain or shortness of breath, pain is crampy, left side, waxes and wanes, worse when she is about to have a bowel movement better when she does.  No recent travel no recent antibiotics no melena no bright red blood per rectum.  No prior treatment,    History reviewed. No pertinent past medical history.  There are no active problems to display for this patient.   Past Surgical History:  Procedure Laterality Date  . CHOLECYSTECTOMY    . TUBAL LIGATION    . TUBAL LIGATION      Prior to Admission medications   Medication Sig Start Date End Date Taking? Authorizing Provider  clindamycin (CLEOCIN) 300 MG capsule Take 1 capsule (300 mg total) by mouth 3 (three) times daily. 07/07/15   Beers, Charmayne Sheer, PA-C  lidocaine (XYLOCAINE) 2 % solution Use as directed 20 mLs in the mouth or throat as needed for mouth pain. 07/07/15   Beers, Charmayne Sheer, PA-C  oxyCODONE-acetaminophen (ROXICET) 5-325 MG tablet Take 1-2 tablets by mouth every 4 (four) hours as needed for severe pain. 07/07/15   Beers, Charmayne Sheer, PA-C    Allergies Morphine and related; Tape; and Penicillins  No family history on file.  Social History Social History   Tobacco Use  . Smoking status: Current Every Day Smoker    Packs/day: 0.50    Types: Cigarettes  . Smokeless tobacco: Never Used  Substance Use Topics  . Alcohol use: No  . Drug  use: No    Review of Systems Constitutional: No fever/chills Eyes: No visual changes. ENT: No sore throat. No stiff neck no neck pain Cardiovascular: Denies chest pain. Respiratory: Denies shortness of breath. Gastrointestinal:   no vomiting.  + diarrhea.  No constipation. Genitourinary: Negative for dysuria. Musculoskeletal: Negative lower extremity swelling Skin: Negative for rash. Neurological: Negative for severe headaches, focal weakness or numbness.   ____________________________________________   PHYSICAL EXAM:  VITAL SIGNS: ED Triage Vitals  Enc Vitals Group     BP 02/15/17 1650 101/69     Pulse Rate 02/15/17 1650 89     Resp 02/15/17 1650 18     Temp 02/15/17 1650 98.4 F (36.9 C)     Temp Source 02/15/17 1650 Oral     SpO2 02/15/17 1650 99 %     Weight 02/15/17 1650 137 lb (62.1 kg)     Height 02/15/17 1650 5\' 2"  (1.575 m)     Head Circumference --      Peak Flow --      Pain Score 02/15/17 1659 5     Pain Loc --      Pain Edu? --      Excl. in [place]? --     Constitutional: Alert and oriented. Well appearing and in no acute distress. Eyes: Conjunctivae are normal Head: Atraumatic HEENT: No congestion/rhinnorhea. Mucous membranes are moist.  Oropharynx non-erythematous Neck:   Nontender with no meningismus, no masses, no stridor Cardiovascular: Normal rate,  regular rhythm. Grossly normal heart sounds.  Good peripheral circulation. Respiratory: Normal respiratory effort.  No retractions. Lungs CTAB. Abdominal: Soft and tenderness with voluntary guarding, no rebound to the left upper quadrant. No distention. Back:  There is no focal tenderness or step off.  there is no midline tenderness there are no lesions noted. there is no CVA tenderness Musculoskeletal: No lower extremity tenderness, no upper extremity tenderness. No joint effusions, no DVT signs strong distal pulses no edema Neurologic:  Normal speech and language. No gross focal neurologic deficits are  appreciated.  Skin:  Skin is warm, dry and intact. No rash noted. Psychiatric: Mood and affect are normal. Speech and behavior are normal.  ____________________________________________   LABS (all labs ordered are listed, but only abnormal results are displayed)  Labs Reviewed  COMPREHENSIVE METABOLIC PANEL - Abnormal; Notable for the following components:      Result Value   Glucose, Bld 111 (*)    All other components within normal limits  URINALYSIS, COMPLETE (UACMP) WITH MICROSCOPIC - Abnormal; Notable for the following components:   Color, Urine AMBER (*)    APPearance HAZY (*)    Hgb urine dipstick SMALL (*)    Squamous Epithelial / LPF 0-5 (*)    All other components within normal limits  LIPASE, BLOOD  CBC  POC URINE PREG, ED  POCT PREGNANCY, URINE    Pertinent labs  results that were available during my care of the patient were reviewed by me and considered in my medical decision making (see chart for details). ____________________________________________  EKG  I personally interpreted any EKGs ordered by me or triage Sinus rhythm rate 90 bpm no acute ST elevation no acute ST depression normal axis unremarkable EKG ____________________________________________  RADIOLOGY  Pertinent labs & imaging results that were available during my care of the patient were reviewed by me and considered in my medical decision making (see chart for details). If possible, patient and/or family made aware of any abnormal findings.  No results found. ____________________________________________    PROCEDURES  Procedure(s) performed: None  Procedures  Critical Care performed: None  ____________________________________________   INITIAL IMPRESSION / ASSESSMENT AND PLAN / ED COURSE  Pertinent labs & imaging results that were available during my care of the patient were reviewed by me and considered in my medical decision making (see chart for details).  Patient here with  left-sided abdominal discomfort context of significant diarrhea.  Most likely this is a food poisoning, or virally mediated however given degree of discomfort and we will obtain CT scan declines pain medication at this time has intolerances to most pain medications she states.  Signs and blood work are reassuring.  If patient has diverticulitis it would change management.  ----------------------------------------- 9:03 PM on 02/15/2017 -----------------------------------------  Pt in nad still with occasional cramping abd b9. Will d.c w/ work note. Most likely this is the [place] food. Made aware of all findings. Return precautions and f/u given and understood. Pt has a percocet here, she states unequivocally that she is not allergic.     ____________________________________________   FINAL CLINICAL IMPRESSION(S) / ED DIAGNOSES  Final diagnoses:  None      This chart was dictated using voice recognition software.  Despite best efforts to proofread,  errors can occur which can change meaning.      Jeanmarie PlantMcShane, Kaitlin Alcindor A, MD 02/15/17 1929    Jeanmarie PlantMcShane, Deniah Saia A, MD 02/15/17 2104    Jeanmarie PlantMcShane, Mayelin Panos A, MD 02/15/17 2107

## 2017-02-15 NOTE — ED Notes (Signed)
Pt reports taking Percocet in past with out any problems in regards to allergic reaction.

## 2018-11-07 ENCOUNTER — Other Ambulatory Visit: Payer: Self-pay

## 2018-11-07 DIAGNOSIS — Z20822 Contact with and (suspected) exposure to covid-19: Secondary | ICD-10-CM

## 2018-11-09 LAB — NOVEL CORONAVIRUS, NAA: SARS-CoV-2, NAA: NOT DETECTED

## 2018-11-27 ENCOUNTER — Emergency Department
Admission: EM | Admit: 2018-11-27 | Discharge: 2018-11-27 | Disposition: A | Discharge status: Home / Self Care | Payer: Self-pay | Attending: Emergency Medicine | Admitting: Emergency Medicine

## 2018-11-27 ENCOUNTER — Emergency Department: Payer: Self-pay

## 2018-11-27 ENCOUNTER — Encounter: Payer: Self-pay | Admitting: Emergency Medicine

## 2018-11-27 ENCOUNTER — Other Ambulatory Visit: Payer: Self-pay

## 2018-11-27 DIAGNOSIS — K5792 Diverticulitis of intestine, part unspecified, without perforation or abscess without bleeding: Secondary | ICD-10-CM

## 2018-11-27 DIAGNOSIS — K5732 Diverticulitis of large intestine without perforation or abscess without bleeding: Secondary | ICD-10-CM | POA: Insufficient documentation

## 2018-11-27 DIAGNOSIS — F17210 Nicotine dependence, cigarettes, uncomplicated: Secondary | ICD-10-CM | POA: Insufficient documentation

## 2018-11-27 LAB — URINALYSIS, COMPLETE (UACMP) WITH MICROSCOPIC
Bacteria, UA: NONE SEEN
Bilirubin Urine: NEGATIVE
Glucose, UA: NEGATIVE mg/dL
Hgb urine dipstick: NEGATIVE
Ketones, ur: NEGATIVE mg/dL
Leukocytes,Ua: NEGATIVE
Nitrite: NEGATIVE
Protein, ur: NEGATIVE mg/dL
Specific Gravity, Urine: 1.018 (ref 1.005–1.030)
pH: 6 (ref 5.0–8.0)

## 2018-11-27 LAB — COMPREHENSIVE METABOLIC PANEL
ALT: 22 U/L (ref 0–44)
AST: 16 U/L (ref 15–41)
Albumin: 4.2 g/dL (ref 3.5–5.0)
Alkaline Phosphatase: 45 U/L (ref 38–126)
Anion gap: 10 (ref 5–15)
BUN: 8 mg/dL (ref 6–20)
CO2: 25 mmol/L (ref 22–32)
Calcium: 9.6 mg/dL (ref 8.9–10.3)
Chloride: 105 mmol/L (ref 98–111)
Creatinine, Ser: 0.61 mg/dL (ref 0.44–1.00)
GFR calc Af Amer: >60 mL/min (ref >60)
GFR calc non Af Amer: >60 mL/min (ref >60)
Glucose, Bld: 98 mg/dL (ref 70–99)
Potassium: 4.3 mmol/L (ref 3.5–5.1)
Sodium: 140 mmol/L (ref 135–145)
Total Bilirubin: 0.6 mg/dL (ref 0.3–1.2)
Total Protein: 7.9 g/dL (ref 6.5–8.1)

## 2018-11-27 LAB — CBC
HCT: 42.8 % (ref 36.0–46.0)
Hemoglobin: 14.5 g/dL (ref 12.0–15.0)
MCH: 32 pg (ref 26.0–34.0)
MCHC: 33.9 g/dL (ref 30.0–36.0)
MCV: 94.5 fL (ref 80.0–100.0)
Platelets: 212 10*3/uL (ref 150–400)
RBC: 4.53 MIL/uL (ref 3.87–5.11)
RDW: 12.7 % (ref 11.5–15.5)
WBC: 8.6 10*3/uL (ref 4.0–10.5)
nRBC: 0 % (ref 0.0–0.2)

## 2018-11-27 LAB — LIPASE, BLOOD: Lipase: 20 U/L (ref 11–51)

## 2018-11-27 MED ORDER — KETOROLAC TROMETHAMINE 30 MG/ML IJ SOLN: 30.0000 mg | Freq: Once | INTRAMUSCULAR | Status: DC

## 2018-11-27 MED ORDER — IOHEXOL 300 MG/ML  SOLN
100.0000 mL | Freq: Once | INTRAMUSCULAR | Status: AC | PRN
  Administered 2018-11-27: 12:00:00 100 mL via INTRAVENOUS
  Filled 2018-11-27: qty 100

## 2018-11-27 MED ORDER — KETOROLAC TROMETHAMINE 30 MG/ML IJ SOLN
INTRAMUSCULAR | Status: AC
  Administered 2018-11-27: 30 mg via INTRAVENOUS
  Filled 2018-11-27: qty 1

## 2018-11-27 MED ORDER — SODIUM CHLORIDE 0.9% FLUSH
3.0000 mL | Freq: Once | INTRAVENOUS | Status: AC
  Administered 2018-11-27: 14:00:00 3 mL via INTRAVENOUS

## 2018-11-27 MED ORDER — OXYCODONE-ACETAMINOPHEN 5-325 MG PO TABS
1.0000 | ORAL_TABLET | Freq: Once | ORAL | Status: AC
  Administered 2018-11-27: 15:00:00 1 via ORAL
  Filled 2018-11-27: qty 1

## 2018-11-27 MED ORDER — CIPROFLOXACIN HCL 500 MG PO TABS: 500.0000 mg | ORAL_TABLET | Freq: Two times a day (BID) | ORAL | 0 refills | Status: AC

## 2018-11-27 MED ORDER — ONDANSETRON 4 MG PO TBDP: 4.0000 mg | ORAL_TABLET | Freq: Three times a day (TID) | ORAL | 0 refills | Status: AC | PRN

## 2018-11-27 MED ORDER — KETOROLAC TROMETHAMINE 30 MG/ML IJ SOLN
30.0000 mg | Freq: Once | INTRAMUSCULAR | Status: AC
  Administered 2018-11-27: 12:00:00 30 mg via INTRAVENOUS

## 2018-11-27 MED ORDER — HYDROMORPHONE HCL 1 MG/ML IJ SOLN
1.0000 mg | Freq: Once | INTRAMUSCULAR | Status: AC
  Administered 2018-11-27: 14:00:00 1 mg via INTRAVENOUS
  Filled 2018-11-27: qty 1

## 2018-11-27 MED ORDER — CIPROFLOXACIN HCL 500 MG PO TABS
500.0000 mg | ORAL_TABLET | Freq: Once | ORAL | Status: AC
  Administered 2018-11-27: 500 mg via ORAL
  Filled 2018-11-27: qty 1

## 2018-11-27 MED ORDER — METRONIDAZOLE 500 MG PO TABS: 500.0000 mg | ORAL_TABLET | Freq: Two times a day (BID) | ORAL | 0 refills | Status: AC

## 2018-11-27 MED ORDER — METRONIDAZOLE 500 MG PO TABS
500.0000 mg | ORAL_TABLET | Freq: Once | ORAL | Status: AC
  Administered 2018-11-27: 15:00:00 500 mg via ORAL
  Filled 2018-11-27: qty 1

## 2018-11-27 MED ORDER — FENTANYL CITRATE (PF) 100 MCG/2ML IJ SOLN
50.0000 ug | Freq: Once | INTRAMUSCULAR | Status: AC
  Administered 2018-11-27: 13:00:00 50 ug via INTRAVENOUS
  Filled 2018-11-27: qty 2

## 2018-11-27 MED ORDER — OXYCODONE-ACETAMINOPHEN 5-325 MG PO TABS: 1.0000 | ORAL_TABLET | Freq: Three times a day (TID) | ORAL | 0 refills | Status: DC | PRN

## 2018-11-27 NOTE — ED Triage Notes (Signed)
Pt sates Sunday Am at approx 04:43 she started with pain to her RLQ. Pt reports now pain is across her abd and she feels nauseated. Pt reports hurts worse when she eats. Pt states even hurts to have her pants on her belly.

## 2018-11-27 NOTE — ED Notes (Signed)
ED Provider at bedside. 

## 2018-11-27 NOTE — ED Provider Notes (Signed)
[facility] Emergency Department Provider Note   ____________________________________________    I have reviewed the triage vital signs and the nursing notes.   HISTORY  Chief Complaint Abdominal Pain     HPI Claudia Sexton is a 48 y.o. female who presents with 2 days of abdominal pain.  Patient describes left lower quadrant abdominal pain which is severe, made worse by her waistband.  She reports she has been wearing loose fitting close.  The pain is been constant.  She has never had this before.  She has had a cholecystectomy.  Denies dysuria, no hematuria.  Mother does have a history of kidney stones.  No fevers or chills.  Mild nausea no vomiting.  Is not take anything for this.  Pain radiates to her right lower quadrant  History reviewed. No pertinent past medical history.  There are no active problems to display for this patient.   Past Surgical History:  Procedure Laterality Date  . CHOLECYSTECTOMY    . TUBAL LIGATION    . TUBAL LIGATION      Prior to Admission medications   Medication Sig Start Date End Date Taking? Authorizing Provider  ciprofloxacin (CIPRO) 500 MG tablet Take 1 tablet (500 mg total) by mouth 2 (two) times daily. 11/27/18   Lavonia Drafts, MD  metroNIDAZOLE (FLAGYL) 500 MG tablet Take 1 tablet (500 mg total) by mouth 2 (two) times daily after a meal. 11/27/18   Lavonia Drafts, MD  ondansetron (ZOFRAN ODT) 4 MG disintegrating tablet Take 1 tablet (4 mg total) by mouth every 8 (eight) hours as needed. 11/27/18   Lavonia Drafts, MD  oxyCODONE-acetaminophen (PERCOCET) 5-325 MG tablet Take 1-2 tablets by mouth every 8 (eight) hours as needed for severe pain. 11/27/18 11/27/19  Lavonia Drafts, MD     Allergies Morphine and related, Tape, and Penicillins  No family history on file.  Social History Social History   Tobacco Use  . Smoking status: Current Every Day Smoker    Packs/day: 0.50    Types: Cigarettes  .  Smokeless tobacco: Never Used  Substance Use Topics  . Alcohol use: No  . Drug use: No    Review of Systems  Constitutional: No fever/chills Eyes: No visual changes.  ENT: No sore throat. Cardiovascular: Denies chest pain. Respiratory: Denies shortness of breath. Gastrointestinal: As above.   Genitourinary: As above Musculoskeletal: No myalgias Skin: Negative for rash. Neurological: Negative for headaches or weakness   ____________________________________________   PHYSICAL EXAM:  VITAL SIGNS: ED Triage Vitals  Enc Vitals Group     BP --      Pulse --      Resp --      Temp --      Temp src --      SpO2 --      Weight 11/27/18 1049 61.2 kg (135 lb)     Height 11/27/18 1049 1.575 m (5\' 2" )     Head Circumference --      Peak Flow --      Pain Score 11/27/18 1048 3     Pain Loc --      Pain Edu? --      Excl. in [place]? --     Constitutional: Alert and oriented.   Nose: No congestion/rhinnorhea. Cardiovascular: Normal rate, regular rhythm. Grossly normal heart sounds.  Good peripheral circulation. Respiratory: Normal respiratory effort.  No retractions. Lungs CTAB. Gastrointestinal: Significant tenderness palpation left lower quadrant, no distention, no CVA tenderness Genitourinary:  deferred  Musculoskeletal:  Warm and well perfused Neurologic:  Normal speech and language. No gross focal neurologic deficits are appreciated.  Skin:  Skin is warm, dry and intact. No rash noted. Psychiatric: Mood and affect are normal. Speech and behavior are normal.  ____________________________________________   LABS (all labs ordered are listed, but only abnormal results are displayed)  Labs Reviewed  URINALYSIS, COMPLETE (UACMP) WITH MICROSCOPIC - Abnormal; Notable for the following components:      Result Value   Color, Urine YELLOW (*)    APPearance HAZY (*)    All other components within normal limits  LIPASE, BLOOD  COMPREHENSIVE METABOLIC PANEL  CBC    ____________________________________________  EKG  None ____________________________________________  RADIOLOGY  CT scan demonstrates uncomplicated diverticulitis ____________________________________________   PROCEDURES  Procedure(s) performed: No  Procedures   Critical Care performed: No ____________________________________________   INITIAL IMPRESSION / ASSESSMENT AND PLAN / ED COURSE  Pertinent labs & imaging results that were available during my care of the patient were reviewed by me and considered in my medical decision making (see chart for details).  Patient presents with left lower quadrant pain primarily as described above.  Suspicious for diverticulitis, differential also includes urinary tract infection, kidney stone.  However urinalysis overall reassuring.  Some RBCs noted.  Will give IV Toradol given morphine allergy and obtain CT abdomen pelvis  CT scan demonstrates uncomplicated diverticulitis, patient continues to have significant pain, will give IV fentanyl after discussion with patient regarding her allergies.  Patient with some improvement with fentanyl but still quite pained, will give IV Dilaudid  Significant provement after Dilaudid.  Offered admission to the patient however she would strongly like to go home.  This is reasonable given reassuring labs, CT scan, strict return precautions discussed, will treat with Cipro Flagyl Percocet, recommend laxative as well    ____________________________________________   FINAL CLINICAL IMPRESSION(S) / ED DIAGNOSES  Final diagnoses:  Diverticulitis        Note:  This document was prepared using Dragon voice recognition software and may include unintentional dictation errors.   Jene Every, MD 11/27/18 [phone]

## 2018-12-01 ENCOUNTER — Emergency Department
Admission: EM | Admit: 2018-12-01 | Discharge: 2018-12-01 | Disposition: A | Discharge status: Home / Self Care | Payer: Self-pay | Attending: Emergency Medicine | Admitting: Emergency Medicine

## 2018-12-01 ENCOUNTER — Other Ambulatory Visit: Payer: Self-pay

## 2018-12-01 DIAGNOSIS — K5792 Diverticulitis of intestine, part unspecified, without perforation or abscess without bleeding: Secondary | ICD-10-CM | POA: Insufficient documentation

## 2018-12-01 DIAGNOSIS — F17210 Nicotine dependence, cigarettes, uncomplicated: Secondary | ICD-10-CM | POA: Insufficient documentation

## 2018-12-01 LAB — COMPREHENSIVE METABOLIC PANEL
ALT: 17 U/L (ref 0–44)
AST: 14 U/L — ABNORMAL LOW (ref 15–41)
Albumin: 4 g/dL (ref 3.5–5.0)
Alkaline Phosphatase: 45 U/L (ref 38–126)
Anion gap: 9 (ref 5–15)
BUN: 11 mg/dL (ref 6–20)
CO2: 22 mmol/L (ref 22–32)
Calcium: 9.2 mg/dL (ref 8.9–10.3)
Chloride: 107 mmol/L (ref 98–111)
Creatinine, Ser: 0.73 mg/dL (ref 0.44–1.00)
GFR calc Af Amer: >60 mL/min (ref >60)
GFR calc non Af Amer: >60 mL/min (ref >60)
Glucose, Bld: 110 mg/dL — ABNORMAL HIGH (ref 70–99)
Potassium: 4.3 mmol/L (ref 3.5–5.1)
Sodium: 138 mmol/L (ref 135–145)
Total Bilirubin: 0.4 mg/dL (ref 0.3–1.2)
Total Protein: 7.3 g/dL (ref 6.5–8.1)

## 2018-12-01 LAB — URINALYSIS, COMPLETE (UACMP) WITH MICROSCOPIC
Bilirubin Urine: NEGATIVE
Glucose, UA: NEGATIVE mg/dL
Hgb urine dipstick: NEGATIVE
Ketones, ur: NEGATIVE mg/dL
Leukocytes,Ua: NEGATIVE
Nitrite: NEGATIVE
Protein, ur: NEGATIVE mg/dL
Specific Gravity, Urine: 1.014 (ref 1.005–1.030)
pH: 7 (ref 5.0–8.0)

## 2018-12-01 LAB — CBC
HCT: 40.8 % (ref 36.0–46.0)
Hemoglobin: 14.4 g/dL (ref 12.0–15.0)
MCH: 32.3 pg (ref 26.0–34.0)
MCHC: 35.3 g/dL (ref 30.0–36.0)
MCV: 91.5 fL (ref 80.0–100.0)
Platelets: 244 10*3/uL (ref 150–400)
RBC: 4.46 MIL/uL (ref 3.87–5.11)
RDW: 12.4 % (ref 11.5–15.5)
WBC: 6 10*3/uL (ref 4.0–10.5)
nRBC: 0 % (ref 0.0–0.2)

## 2018-12-01 LAB — POCT PREGNANCY, URINE: Preg Test, Ur: NEGATIVE

## 2018-12-01 LAB — LIPASE, BLOOD: Lipase: 23 U/L (ref 11–51)

## 2018-12-01 MED ORDER — HYDROCODONE-ACETAMINOPHEN 5-325 MG PO TABS: 1.0000 | ORAL_TABLET | Freq: Four times a day (QID) | ORAL | 0 refills | Status: AC | PRN

## 2018-12-01 MED ORDER — ONDANSETRON HCL 4 MG/2ML IJ SOLN
4.0000 mg | Freq: Once | INTRAMUSCULAR | Status: AC
  Administered 2018-12-01: 13:00:00 4 mg via INTRAVENOUS
  Filled 2018-12-01: qty 2

## 2018-12-01 MED ORDER — HYDROMORPHONE HCL 1 MG/ML IJ SOLN
1.0000 mg | Freq: Once | INTRAMUSCULAR | Status: AC
  Administered 2018-12-01: 13:00:00 1 mg via INTRAVENOUS
  Filled 2018-12-01: qty 1

## 2018-12-01 MED ORDER — SODIUM CHLORIDE 0.9 % IV SOLN
1000.0000 mL | Freq: Once | INTRAVENOUS | Status: AC
  Administered 2018-12-01: 13:00:00 1000 mL via INTRAVENOUS

## 2018-12-01 NOTE — ED Triage Notes (Signed)
Pt recently dx with diverticulitis and diverticulosis, states that she started having pain to LLQ yesterday, nausea and diarrhea. Pt currently taking ABX for diverticulitis.  Pt alert and oriented X4, cooperative, RR even and unlabored, color WNL. Pt in NAD.

## 2018-12-01 NOTE — ED Provider Notes (Signed)
[facility] Emergency Department Provider Note   ____________________________________________    I have reviewed the triage vital signs and the nursing notes.   HISTORY  Chief Complaint Abdominal Pain     HPI Claudia Sexton is a 48 y.o. female who presents with complaints of abdominal pain.  Patient seen by me 4 days ago and diagnosed with uncomplicated diverticulitis.  She reports that she had been feeling better but started to eat solid food today and developed cramping in her left lower quadrant again that she describes as severe.  She also reports nausea.  Denies fevers or chills.  Has been taking her Cipro and Flagyl as prescribed.  History reviewed. No pertinent past medical history.  There are no active problems to display for this patient.   Past Surgical History:  Procedure Laterality Date  . CHOLECYSTECTOMY    . TUBAL LIGATION    . TUBAL LIGATION      Prior to Admission medications   Medication Sig Start Date End Date Taking? Authorizing Provider  ciprofloxacin (CIPRO) 500 MG tablet Take 1 tablet (500 mg total) by mouth 2 (two) times daily. 11/27/18   Lavonia Drafts, MD  HYDROcodone-acetaminophen (NORCO/VICODIN) 5-325 MG tablet Take 1 tablet by mouth every 6 (six) hours as needed for severe pain. 12/01/18   Lavonia Drafts, MD  metroNIDAZOLE (FLAGYL) 500 MG tablet Take 1 tablet (500 mg total) by mouth 2 (two) times daily after a meal. 11/27/18   Lavonia Drafts, MD  ondansetron (ZOFRAN ODT) 4 MG disintegrating tablet Take 1 tablet (4 mg total) by mouth every 8 (eight) hours as needed. 11/27/18   Lavonia Drafts, MD     Allergies Morphine and related, Tape, and Penicillins  No family history on file.  Social History Social History   Tobacco Use  . Smoking status: Current Every Day Smoker    Packs/day: 0.50    Types: Cigarettes  . Smokeless tobacco: Never Used  Substance Use Topics  . Alcohol use: No  . Drug use: No    Review  of Systems  Constitutional: No fever/chills Eyes: No visual changes.  ENT: No sore throat. Cardiovascular: Denies chest pain. Respiratory: Denies shortness of breath. Gastrointestinal: As above Genitourinary: Negative for dysuria. Musculoskeletal: Negative for back pain. Skin: Negative for rash. Neurological: Negative for headaches or weakness   ____________________________________________   PHYSICAL EXAM:  VITAL SIGNS: ED Triage Vitals  Enc Vitals Group     BP 12/01/18 1021 (!) 114/48     Pulse Rate 12/01/18 1021 76     Resp 12/01/18 1021 18     Temp 12/01/18 1021 98.5 F (36.9 C)     Temp Source 12/01/18 1021 Oral     SpO2 12/01/18 1021 97 %     Weight 12/01/18 1027 61 kg (134 lb 7.7 oz)     Height 12/01/18 1027 1.575 m (5\' 2" )     Head Circumference --      Peak Flow --      Pain Score 12/01/18 1027 4     Pain Loc --      Pain Edu? --      Excl. in [place]? --     Constitutional: Alert and oriented.  Nose: No congestion/rhinnorhea. Mouth/Throat: Mucous membranes are moist.    Cardiovascular: Normal rate, regular rhythm.  Good peripheral circulation. Respiratory: Normal respiratory effort.  No retractions.  Gastrointestinal: Mild left lower quadrant tenderness palpation, no distention Genitourinary: deferred Musculoskeletal: Warm and well perfused Neurologic:  Normal speech and language. No gross focal neurologic deficits are appreciated.  Skin:  Skin is warm, dry and intact. No rash noted. Psychiatric: Mood and affect are normal. Speech and behavior are normal.  ____________________________________________   LABS (all labs ordered are listed, but only abnormal results are displayed)  Labs Reviewed  COMPREHENSIVE METABOLIC PANEL - Abnormal; Notable for the following components:      Result Value   Glucose, Bld 110 (*)    AST 14 (*)    All other components within normal limits  URINALYSIS, COMPLETE (UACMP) WITH MICROSCOPIC - Abnormal; Notable for the  following components:   Color, Urine YELLOW (*)    APPearance CLEAR (*)    Bacteria, UA RARE (*)    All other components within normal limits  LIPASE, BLOOD  CBC  POC URINE PREG, ED  POCT PREGNANCY, URINE   ____________________________________________  EKG  None ____________________________________________  RADIOLOGY  None ____________________________________________   PROCEDURES  Procedure(s) performed: No  Procedures   Critical Care performed: No ____________________________________________   INITIAL IMPRESSION / ASSESSMENT AND PLAN / ED COURSE  Pertinent labs & imaging results that were available during my care of the patient were reviewed by me and considered in my medical decision making (see chart for details).  Patient overall well-appearing in no acute distress.  Lab work is reassuring, normal white blood cell count, mild tenderness in the left lower quadrant consistent with diverticulitis.  Suspect recurrence of pain related to eating solid food prematurely.  We will give IV Dilaudid here, IV nausea medication, IV fluids and reevaluate.  Patient reports he feels much better after treatment.  We discussed adjusting her medications and she reports Percocet makes her somewhat nauseated as does the Flagyl.  Overall she feels much better, appropriate for discharge at this time.    ____________________________________________   FINAL CLINICAL IMPRESSION(S) / ED DIAGNOSES  Final diagnoses:  Diverticulitis        Note:  This document was prepared using Dragon voice recognition software and may include unintentional dictation errors.   Jene Every, MD 12/01/18 [phone]

## 2018-12-01 NOTE — ED Notes (Signed)
ED Provider at bedside.
